# Patient Record
Sex: Female | Born: 1960 | Race: White | Hispanic: No | Marital: Married
Health system: Southern US, Community
[De-identification: ages and names within clinical notes are randomized; demographics above are authoritative.]

## PROBLEM LIST (undated history)

## (undated) DIAGNOSIS — E119 Type 2 diabetes mellitus without complications: Secondary | ICD-10-CM

## (undated) DIAGNOSIS — E079 Disorder of thyroid, unspecified: Secondary | ICD-10-CM

## (undated) DIAGNOSIS — E7800 Pure hypercholesterolemia, unspecified: Secondary | ICD-10-CM

## (undated) DIAGNOSIS — G44209 Tension-type headache, unspecified, not intractable: Secondary | ICD-10-CM

## (undated) DIAGNOSIS — I10 Essential (primary) hypertension: Secondary | ICD-10-CM

## (undated) DIAGNOSIS — C541 Malignant neoplasm of endometrium: Secondary | ICD-10-CM

## (undated) DIAGNOSIS — C801 Malignant (primary) neoplasm, unspecified: Secondary | ICD-10-CM

## (undated) DIAGNOSIS — M810 Age-related osteoporosis without current pathological fracture: Secondary | ICD-10-CM

## (undated) DIAGNOSIS — E669 Obesity, unspecified: Secondary | ICD-10-CM

## (undated) HISTORY — PX: CHOLECYSTECTOMY: SHX55

## (undated) HISTORY — PX: ABDOMINAL HYSTERECTOMY: SHX81

---

## 2002-06-23 ENCOUNTER — Emergency Department (HOSPITAL_COMMUNITY): Admission: EM | Admit: 2002-06-23 | Discharge: 2002-06-23 | Payer: Self-pay | Admitting: Emergency Medicine

## 2014-09-25 ENCOUNTER — Emergency Department (HOSPITAL_COMMUNITY)
Admission: EM | Admit: 2014-09-25 | Discharge: 2014-09-26 | Disposition: A | Discharge status: Home / Self Care | Payer: Managed Care, Other (non HMO) | Attending: Emergency Medicine | Admitting: Emergency Medicine

## 2014-09-25 ENCOUNTER — Encounter (HOSPITAL_COMMUNITY): Payer: Self-pay | Admitting: *Deleted

## 2014-09-25 DIAGNOSIS — R630 Anorexia: Secondary | ICD-10-CM | POA: Insufficient documentation

## 2014-09-25 DIAGNOSIS — Z8542 Personal history of malignant neoplasm of other parts of uterus: Secondary | ICD-10-CM | POA: Insufficient documentation

## 2014-09-25 DIAGNOSIS — I10 Essential (primary) hypertension: Secondary | ICD-10-CM | POA: Diagnosis not present

## 2014-09-25 DIAGNOSIS — M810 Age-related osteoporosis without current pathological fracture: Secondary | ICD-10-CM | POA: Insufficient documentation

## 2014-09-25 DIAGNOSIS — E780 Pure hypercholesterolemia: Secondary | ICD-10-CM | POA: Diagnosis not present

## 2014-09-25 DIAGNOSIS — R1031 Right lower quadrant pain: Secondary | ICD-10-CM | POA: Insufficient documentation

## 2014-09-25 DIAGNOSIS — Z90710 Acquired absence of both cervix and uterus: Secondary | ICD-10-CM | POA: Insufficient documentation

## 2014-09-25 DIAGNOSIS — Z79899 Other long term (current) drug therapy: Secondary | ICD-10-CM | POA: Diagnosis not present

## 2014-09-25 DIAGNOSIS — E119 Type 2 diabetes mellitus without complications: Secondary | ICD-10-CM | POA: Insufficient documentation

## 2014-09-25 DIAGNOSIS — R319 Hematuria, unspecified: Secondary | ICD-10-CM

## 2014-09-25 DIAGNOSIS — E669 Obesity, unspecified: Secondary | ICD-10-CM | POA: Insufficient documentation

## 2014-09-25 DIAGNOSIS — E079 Disorder of thyroid, unspecified: Secondary | ICD-10-CM | POA: Diagnosis not present

## 2014-09-25 DIAGNOSIS — Z9049 Acquired absence of other specified parts of digestive tract: Secondary | ICD-10-CM | POA: Diagnosis not present

## 2014-09-25 HISTORY — DX: Type 2 diabetes mellitus without complications: E11.9

## 2014-09-25 HISTORY — DX: Tension-type headache, unspecified, not intractable: G44.209

## 2014-09-25 HISTORY — DX: Essential (primary) hypertension: I10

## 2014-09-25 HISTORY — DX: Age-related osteoporosis without current pathological fracture: M81.0

## 2014-09-25 HISTORY — DX: Obesity, unspecified: E66.9

## 2014-09-25 HISTORY — DX: Disorder of thyroid, unspecified: E07.9

## 2014-09-25 HISTORY — DX: Malignant (primary) neoplasm, unspecified: C80.1

## 2014-09-25 HISTORY — DX: Malignant neoplasm of endometrium: C54.1

## 2014-09-25 HISTORY — DX: Pure hypercholesterolemia, unspecified: E78.00

## 2014-09-25 LAB — COMPREHENSIVE METABOLIC PANEL
ALT: 19 U/L (ref 0–35)
AST: 18 U/L (ref 0–37)
Albumin: 3.7 g/dL (ref 3.5–5.2)
Alkaline Phosphatase: 88 U/L (ref 39–117)
Anion gap: 5 (ref 5–15)
BUN: 11 mg/dL (ref 6–23)
CALCIUM: 9.1 mg/dL (ref 8.4–10.5)
CO2: 25 mmol/L (ref 19–32)
Chloride: 110 mmol/L (ref 96–112)
Creatinine, Ser: 0.88 mg/dL (ref 0.50–1.10)
GFR calc Af Amer: 84 mL/min — ABNORMAL LOW (ref >90)
GFR, EST NON AFRICAN AMERICAN: 73 mL/min — AB (ref >90)
GLUCOSE: 133 mg/dL — AB (ref 70–99)
Potassium: 4 mmol/L (ref 3.5–5.1)
SODIUM: 140 mmol/L (ref 135–145)
Total Bilirubin: 0.6 mg/dL (ref 0.3–1.2)
Total Protein: 7 g/dL (ref 6.0–8.3)

## 2014-09-25 LAB — LIPASE, BLOOD: Lipase: 40 U/L (ref 11–59)

## 2014-09-25 LAB — URINALYSIS, ROUTINE W REFLEX MICROSCOPIC
Bilirubin Urine: NEGATIVE
Glucose, UA: NEGATIVE mg/dL
Ketones, ur: NEGATIVE mg/dL
NITRITE: NEGATIVE
PH: 5 (ref 5.0–8.0)
Protein, ur: NEGATIVE mg/dL
Specific Gravity, Urine: 1.02 (ref 1.005–1.030)
UROBILINOGEN UA: 0.2 mg/dL (ref 0.0–1.0)

## 2014-09-25 LAB — URINE MICROSCOPIC-ADD ON

## 2014-09-25 LAB — CBC WITH DIFFERENTIAL/PLATELET
BASOS ABS: 0 10*3/uL (ref 0.0–0.1)
BASOS PCT: 0 % (ref 0–1)
Eosinophils Absolute: 0.1 10*3/uL (ref 0.0–0.7)
Eosinophils Relative: 1 % (ref 0–5)
HCT: 40.9 % (ref 36.0–46.0)
HEMOGLOBIN: 13.8 g/dL (ref 12.0–15.0)
LYMPHS PCT: 9 % — AB (ref 12–46)
Lymphs Abs: 0.8 10*3/uL (ref 0.7–4.0)
MCH: 30.9 pg (ref 26.0–34.0)
MCHC: 33.7 g/dL (ref 30.0–36.0)
MCV: 91.5 fL (ref 78.0–100.0)
Monocytes Absolute: 0.5 10*3/uL (ref 0.1–1.0)
Monocytes Relative: 5 % (ref 3–12)
NEUTROS PCT: 85 % — AB (ref 43–77)
Neutro Abs: 7.8 10*3/uL — ABNORMAL HIGH (ref 1.7–7.7)
Platelets: 260 10*3/uL (ref 150–400)
RBC: 4.47 MIL/uL (ref 3.87–5.11)
RDW: 14 % (ref 11.5–15.5)
WBC: 9.2 10*3/uL (ref 4.0–10.5)

## 2014-09-25 MED ORDER — IOHEXOL 300 MG/ML  SOLN
25.0000 mL | Freq: Once | INTRAMUSCULAR | Status: AC | PRN
  Administered 2014-09-25: 25 mL via ORAL

## 2014-09-25 NOTE — ED Notes (Signed)
Pt reports onset today of RLQ pain today. Pain occurs in waves, having nausea but no vomiting. Had hot flashes pta, not febrile at this time. Pt reports feeling like she needs to having bowel movement but unable to even pass gas.

## 2014-09-25 NOTE — ED Notes (Signed)
MD at bedside. 

## 2014-09-25 NOTE — ED Provider Notes (Signed)
CSN: [id number]     Arrival date & time 09/25/14  1811 History   First MD Initiated Contact with Patient 09/25/14 2036     Chief Complaint  Patient presents with  . Abdominal Pain     (Consider location/radiation/quality/duration/timing/severity/associated sxs/prior Treatment) HPI Comments: Patient presents with right lower quadrant pain that onset today that is beginning worse. Associated with nausea but no vomiting. Associated with hot flashes at home and chills but no fever. No urinary or vaginal symptoms. Unable to pass gas today. Patient with previous hysterectomy and cholecystectomy. Has never had this pain before. Decreased appetite. No chest pain. No change in chronic back pain  The history is provided by the patient and a relative.    Past Medical History  Diagnosis Date  . Obesity   . Tension headache   . Thyroid disease   . High cholesterol   . Hypertension   . Osteoporosis   . Cancer   . Endometrial cancer   . Diabetes mellitus without complication    Past Surgical History  Procedure Laterality Date  . Cholecystectomy    . Abdominal hysterectomy     History reviewed. No pertinent family history. History  Substance Use Topics  . Smoking status: Not on file  . Smokeless tobacco: Not on file  . Alcohol Use: No   OB History    No data available     Review of Systems  Constitutional: Positive for activity change and appetite change. Negative for fever.  HENT: Negative for congestion and rhinorrhea.   Respiratory: Negative for cough, chest tightness and shortness of breath.   Cardiovascular: Negative for chest pain.  Gastrointestinal: Positive for nausea and abdominal pain. Negative for vomiting and constipation.  Genitourinary: Negative for dysuria, hematuria, vaginal bleeding and vaginal discharge.  Musculoskeletal: Negative for myalgias and arthralgias.  Skin: Negative for rash.  Neurological: Negative for dizziness, weakness and headaches.  A complete 10  system review of systems was obtained and all systems are negative except as noted in the HPI and PMH.      Allergies  Review of patient's allergies indicates no known allergies.  Home Medications   Prior to Admission medications   Medication Sig Start Date End Date Taking? Authorizing Provider  alendronate (FOSAMAX) 70 MG tablet Take 70 mg by mouth every Sunday. Take with a full glass of water on an empty stomach.   Yes Historical Provider, MD  atorvastatin (LIPITOR) 40 MG tablet Take 40 mg by mouth at bedtime.   Yes Historical Provider, MD  cholecalciferol (VITAMIN D) 1000 UNITS tablet Take 1,000 Units by mouth daily.   Yes Historical Provider, MD  levothyroxine (SYNTHROID, LEVOTHROID) 175 MCG tablet Take 175 mcg by mouth daily before breakfast.   Yes Historical Provider, MD  losartan (COZAAR) 50 MG tablet Take 50 mg by mouth daily.   Yes Historical Provider, MD  magnesium gluconate (MAGONATE) 500 MG tablet Take 500 mg by mouth at bedtime.    Yes Historical Provider, MD  pregabalin (LYRICA) 75 MG capsule Take 75 mg by mouth at bedtime.   Yes Historical Provider, MD  topiramate (TOPAMAX) 25 MG tablet Take 100 mg by mouth at bedtime.   Yes Historical Provider, MD  venlafaxine XR (EFFEXOR-XR) 75 MG 24 hr capsule Take 75 mg by mouth daily with breakfast.   Yes Historical Provider, MD   BP 126/69 mmHg  Pulse 64  Temp(Src) 97.6 F (36.4 C) (Oral)  Resp 18  SpO2 97% Physical Exam  Constitutional: She  is oriented to person, place, and time. She appears well-developed and well-nourished. No distress.  HENT:  Head: Normocephalic and atraumatic.  Mouth/Throat: Oropharynx is clear and moist. No oropharyngeal exudate.  Eyes: Conjunctivae and EOM are normal. Pupils are equal, round, and reactive to light.  Neck: Normal range of motion. Neck supple.  No meningismus.  Cardiovascular: Normal rate, regular rhythm, normal heart sounds and intact distal pulses.   No murmur heard. Pulmonary/Chest:  Effort normal and breath sounds normal. No respiratory distress.  Abdominal: Soft. There is tenderness. There is guarding. There is no rebound.  RLQ pain with guarding  Musculoskeletal: Normal range of motion. She exhibits no edema or tenderness.  No CVAT  Neurological: She is alert and oriented to person, place, and time. No cranial nerve deficit. She exhibits normal muscle tone. Coordination normal.  No ataxia on finger to nose bilaterally. No pronator drift. 5/5 strength throughout. CN 2-12 intact. Negative Romberg. Equal grip strength. Sensation intact. Gait is normal.   Skin: Skin is warm.  Psychiatric: She has a normal mood and affect. Her behavior is normal.  Nursing note and vitals reviewed.   ED Course  Procedures (including critical care time) Labs Review Labs Reviewed  CBC WITH DIFFERENTIAL/PLATELET - Abnormal; Notable for the following:    Neutrophils Relative % 85 (*)    Neutro Abs 7.8 (*)    Lymphocytes Relative 9 (*)    All other components within normal limits  COMPREHENSIVE METABOLIC PANEL - Abnormal; Notable for the following:    Glucose, Bld 133 (*)    GFR calc non Af Amer 73 (*)    GFR calc Af Amer 84 (*)    All other components within normal limits  URINALYSIS, ROUTINE W REFLEX MICROSCOPIC - Abnormal; Notable for the following:    APPearance CLOUDY (*)    Hgb urine dipstick LARGE (*)    Leukocytes, UA SMALL (*)    All other components within normal limits  URINE MICROSCOPIC-ADD ON - Abnormal; Notable for the following:    Bacteria, UA FEW (*)    All other components within normal limits  LIPASE, BLOOD    Imaging Review No results found.   EKG Interpretation None      MDM   Final diagnoses:  None   right lower quadrant pain. Urinalysis shows small hemoglobin. Question recently passed kidney stone versus appendicitis.  UA with some hematuria. Labs otherwise unremarkable.  CT scan will be obtained to rule out appendicitis. Consider also recently  passed kidney stone.  Care will be transferred to Dr. Claudine Mouton at shift change.    Ezequiel Essex, MD 09/26/14 [phone]

## 2014-09-26 ENCOUNTER — Encounter (HOSPITAL_COMMUNITY): Payer: Self-pay | Admitting: Radiology

## 2014-09-26 ENCOUNTER — Emergency Department (HOSPITAL_COMMUNITY): Payer: Managed Care, Other (non HMO)

## 2014-09-26 MED ORDER — IBUPROFEN 800 MG PO TABS: 800.0000 mg | ORAL_TABLET | Freq: Three times a day (TID) | ORAL | Status: AC | PRN

## 2014-09-26 MED ORDER — IOHEXOL 300 MG/ML  SOLN
100.0000 mL | Freq: Once | INTRAMUSCULAR | Status: AC | PRN
  Administered 2014-09-26: 100 mL via INTRAVENOUS

## 2014-09-26 NOTE — ED Provider Notes (Signed)
This patient was signed out as pending CT scan to evaluate for nephrolithiasis versus appendicitis. CT scan is negative for appendicitis. Urine does show hematuria, patient also states her urine has been darker over the past couple of days. I believe this is consistent with nephrolithiasis. Urology follow-up was given to the patient. On repeat evaluation patient has minimal tenderness in the right lower quadrant. She is advised to continue Motrin at home as needed for pain. She demonstrated understanding to this. Her vital signs remain within her normal limits and she is safe for discharge.  Everlene Balls, MD 09/26/14 [phone]

## 2014-09-26 NOTE — ED Notes (Signed)
Pt. Left with all belongings 

## 2014-09-26 NOTE — Discharge Instructions (Signed)
Abdominal Pain, Women Ms. Shadix, your CT scan shows a normal appendix and no cause of your pain.  Your may have passed a kidney stone as there was microscopic blood in your urine.  Follow up with urology for continued management.  If symptoms worsen, come back to the ED for repeat evaluation.  Thank you. Abdominal (stomach, pelvic, or belly) pain can be caused by many things. It is important to tell your doctor:  The location of the pain.  Does it come and go or is it present all the time?  Are there things that start the pain (eating certain foods, exercise)?  Are there other symptoms associated with the pain (fever, nausea, vomiting, diarrhea)? All of this is helpful to know when trying to find the cause of the pain. CAUSES   Stomach: virus or bacteria infection, or ulcer.  Intestine: appendicitis (inflamed appendix), regional ileitis (Crohn's disease), ulcerative colitis (inflamed colon), irritable bowel syndrome, diverticulitis (inflamed diverticulum of the colon), or cancer of the stomach or intestine.  Gallbladder disease or stones in the gallbladder.  Kidney disease, kidney stones, or infection.  Pancreas infection or cancer.  Fibromyalgia (pain disorder).  Diseases of the female organs:  Uterus: fibroid (non-cancerous) tumors or infection.  Fallopian tubes: infection or tubal pregnancy.  Ovary: cysts or tumors.  Pelvic adhesions (scar tissue).  Endometriosis (uterus lining tissue growing in the pelvis and on the pelvic organs).  Pelvic congestion syndrome (female organs filling up with blood just before the menstrual period).  Pain with the menstrual period.  Pain with ovulation (producing an egg).  Pain with an IUD (intrauterine device, birth control) in the uterus.  Cancer of the female organs.  Functional pain (pain not caused by a disease, may improve without treatment).  Psychological pain.  Depression. DIAGNOSIS  Your doctor will decide the  seriousness of your pain by doing an examination.  Blood tests.  X-rays.  Ultrasound.  CT scan (computed tomography, special type of X-ray).  MRI (magnetic resonance imaging).  Cultures, for infection.  Barium enema (dye inserted in the large intestine, to better view it with X-rays).  Colonoscopy (looking in intestine with a lighted tube).  Laparoscopy (minor surgery, looking in abdomen with a lighted tube).  Major abdominal exploratory surgery (looking in abdomen with a large incision). TREATMENT  The treatment will depend on the cause of the pain.   Many cases can be observed and treated at home.  Over-the-counter medicines recommended by your caregiver.  Prescription medicine.  Antibiotics, for infection.  Birth control pills, for painful periods or for ovulation pain.  Hormone treatment, for endometriosis.  Nerve blocking injections.  Physical therapy.  Antidepressants.  Counseling with a psychologist or psychiatrist.  Minor or major surgery. HOME CARE INSTRUCTIONS   Do not take laxatives, unless directed by your caregiver.  Take over-the-counter pain medicine only if ordered by your caregiver. Do not take aspirin because it can cause an upset stomach or bleeding.  Try a clear liquid diet (broth or water) as ordered by your caregiver. Slowly move to a bland diet, as tolerated, if the pain is related to the stomach or intestine.  Have a thermometer and take your temperature several times a day, and record it.  Bed rest and sleep, if it helps the pain.  Avoid sexual intercourse, if it causes pain.  Avoid stressful situations.  Keep your follow-up appointments and tests, as your caregiver orders.  If the pain does not go away with medicine or surgery, you  may try:  Acupuncture.  Relaxation exercises (yoga, meditation).  Group therapy.  Counseling. SEEK MEDICAL CARE IF:   You notice certain foods cause stomach pain.  Your home care  treatment is not helping your pain.  You need stronger pain medicine.  You want your IUD removed.  You feel faint or lightheaded.  You develop nausea and vomiting.  You develop a rash.  You are having side effects or an allergy to your medicine. SEEK IMMEDIATE MEDICAL CARE IF:   Your pain does not go away or gets worse.  You have a fever.  Your pain is felt only in portions of the abdomen. The right side could possibly be appendicitis. The left lower portion of the abdomen could be colitis or diverticulitis.  You are passing blood in your stools (bright red or black tarry stools, with or without vomiting).  You have blood in your urine.  You develop chills, with or without a fever.  You pass out. MAKE SURE YOU:   Understand these instructions.  Will watch your condition.  Will get help right away if you are not doing well or get worse. Document Released: 05/23/2007 Document Revised: 12/10/2013 Document Reviewed: 06/12/2009 [facility] Patient Information 2015 [place], [place]. This information is not intended to replace advice given to you by your health care provider. Make sure you discuss any questions you have with your health care provider.

## 2019-12-10 ENCOUNTER — Other Ambulatory Visit: Payer: Self-pay | Admitting: Orthopedic Surgery

## 2019-12-10 DIAGNOSIS — M25512 Pain in left shoulder: Secondary | ICD-10-CM

## 2019-12-10 DIAGNOSIS — M25511 Pain in right shoulder: Secondary | ICD-10-CM

## 2019-12-14 ENCOUNTER — Ambulatory Visit
Admission: RE | Admit: 2019-12-14 | Discharge: 2019-12-14 | Disposition: A | Discharge status: Home / Self Care | Payer: 59 | Source: Ambulatory Visit | Attending: Orthopedic Surgery | Admitting: Orthopedic Surgery

## 2019-12-14 ENCOUNTER — Other Ambulatory Visit: Payer: Self-pay | Admitting: Orthopedic Surgery

## 2019-12-14 DIAGNOSIS — M25512 Pain in left shoulder: Secondary | ICD-10-CM

## 2019-12-14 DIAGNOSIS — M25511 Pain in right shoulder: Secondary | ICD-10-CM

## 2021-06-16 ENCOUNTER — Other Ambulatory Visit: Payer: Self-pay | Admitting: Sports Medicine

## 2021-06-16 DIAGNOSIS — M5440 Lumbago with sciatica, unspecified side: Secondary | ICD-10-CM

## 2021-07-08 ENCOUNTER — Ambulatory Visit
Admission: RE | Admit: 2021-07-08 | Discharge: 2021-07-08 | Disposition: A | Discharge status: Home / Self Care | Payer: No Typology Code available for payment source | Source: Ambulatory Visit | Attending: Sports Medicine | Admitting: Sports Medicine

## 2021-07-08 DIAGNOSIS — M5440 Lumbago with sciatica, unspecified side: Secondary | ICD-10-CM

## 2023-06-07 IMAGING — CT CT L SPINE W/O CM
1 of 6 series · 7 of 14 positions shown, 9 images · non-contrast
Comparison: CT abdomen and pelvis 06/10/2020

CLINICAL DATA: Chronic low back pain. Numbness and tingling in the
feet. History of endometrial cancer.

EXAM:
CT LUMBAR SPINE WITHOUT CONTRAST
TECHNIQUE: Multidetector CT imaging of the lumbar spine was performed without
intravenous contrast administration. Multiplanar CT image
reconstructions were also generated.

[Series 3: l spine soft · axial · 0.37mm/px · z∈[-311,-141]mm · 7 of 115 slices shown, 9 images]
[im 15/115  soft-tissue]
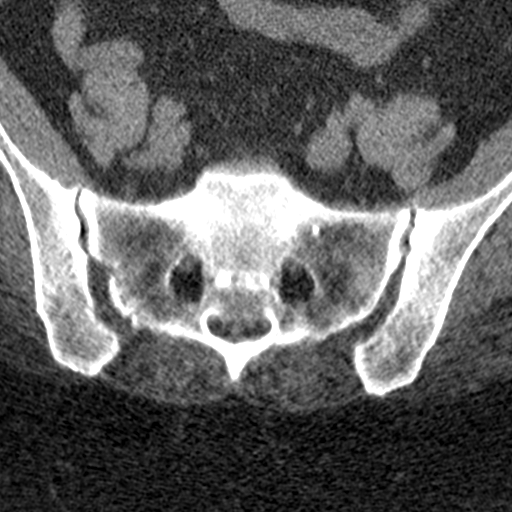
[im 15/115  bone]
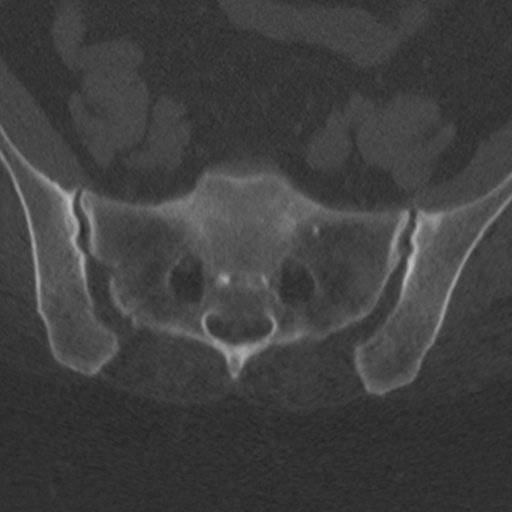
[im 29/115  bone]
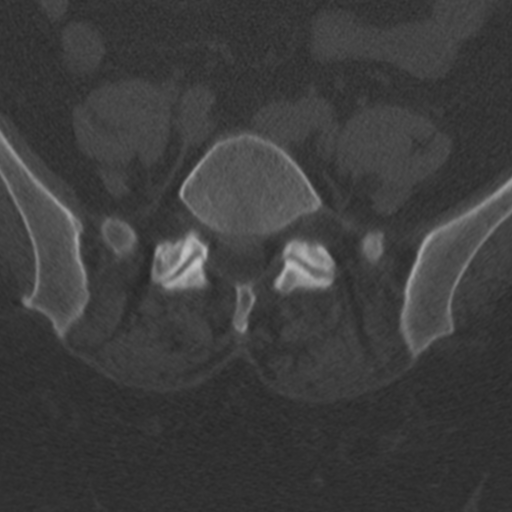
[im 43/115  bone]
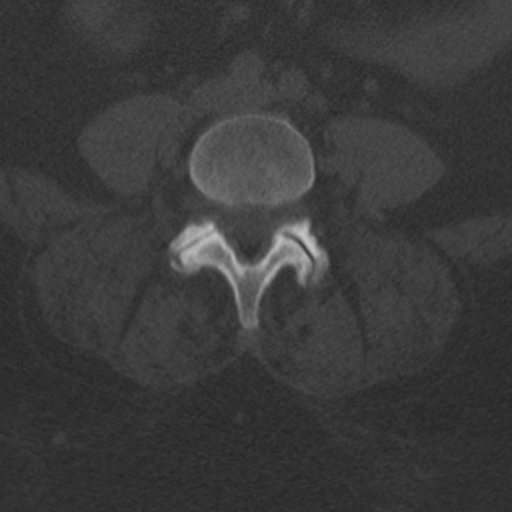
[im 58/115  bone]
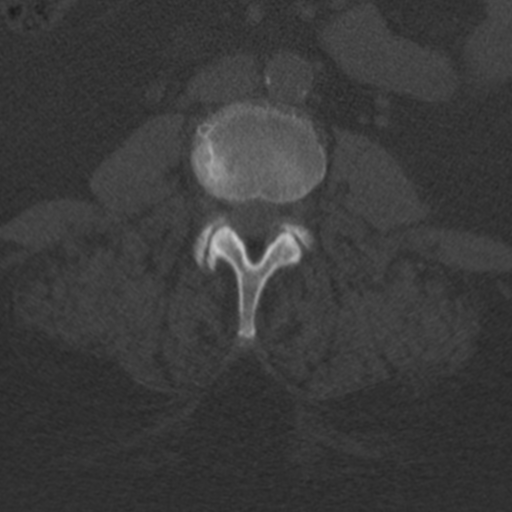
[im 72/115  soft-tissue]
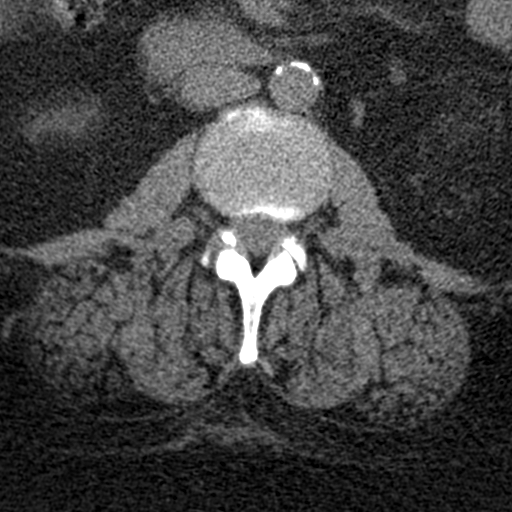
[im 72/115  bone]
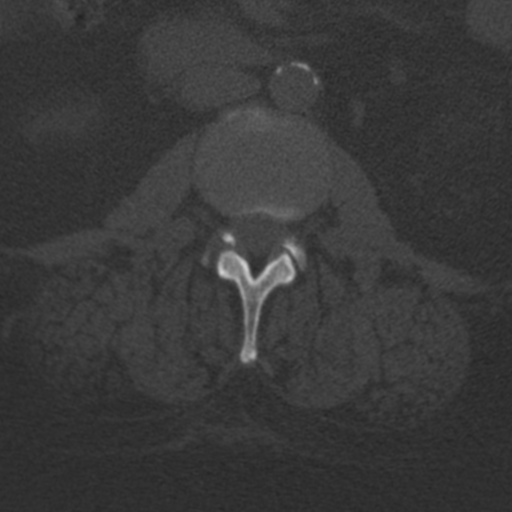
[im 86/115  bone]
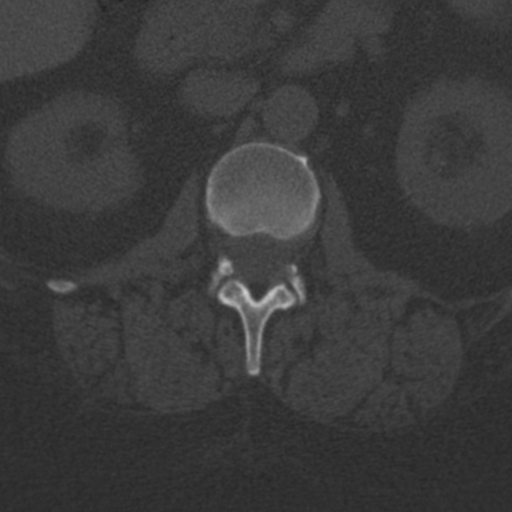
[im 100/115  bone]
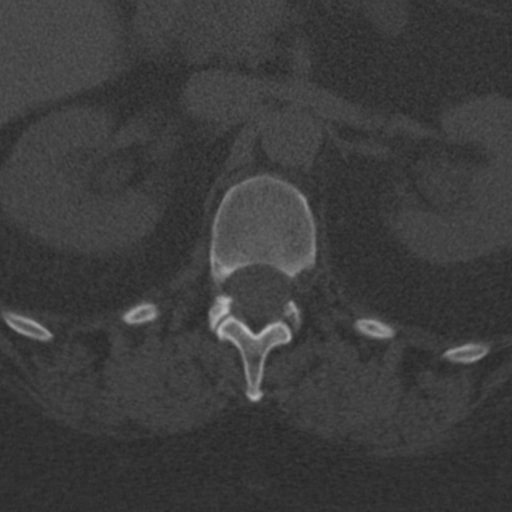

[7 of 14 positions shown; findings below may reference images not displayed]

FINDINGS: Segmentation: 5 lumbar type vertebrae.

Alignment: Trace facet mediated anterolisthesis of L4 on L5. Trace
retrolisthesis of L2 on L3.

Vertebrae: No acute fracture or suspicious osseous lesion.

Paraspinal and other soft tissues: Abdominal aortic atherosclerosis.
Punctate nonobstructing bilateral renal calculi.

Disc levels:

T12-L1 and L1-2: Negative.

L2-3: Trace retrolisthesis and mild facet and ligamentum flavum
hypertrophy without evidence of significant stenosis.

L3-4: Mild disc space narrowing. Mild disc bulging and moderate
facet hypertrophy without evidence of significant stenosis.

L4-5: Anterolisthesis with bulging uncovered disc and severe facet
arthrosis result in mild bilateral neural foraminal stenosis without
evidence of significant spinal stenosis.

L5-S1: Disc bulging and moderate facet arthrosis result in mild
right neural foraminal stenosis. There is a broad central disc
protrusion resulting in borderline spinal stenosis.
IMPRESSION: 1. Severe facet arthrosis at L4-5 with grade 1 anterolisthesis and
mild bilateral neural foraminal stenosis.
2. Borderline spinal stenosis at L5-S1 due to a broad central disc
protrusion.
3. Aortic Atherosclerosis ([id number]).

## 2024-08-05 ENCOUNTER — Emergency Department (HOSPITAL_BASED_OUTPATIENT_CLINIC_OR_DEPARTMENT_OTHER)
Admission: EM | Admit: 2024-08-05 | Discharge: 2024-08-06 | Disposition: A | Attending: Emergency Medicine | Admitting: Emergency Medicine

## 2024-08-05 ENCOUNTER — Encounter (HOSPITAL_BASED_OUTPATIENT_CLINIC_OR_DEPARTMENT_OTHER): Payer: Self-pay | Admitting: Emergency Medicine

## 2024-08-05 ENCOUNTER — Other Ambulatory Visit: Payer: Self-pay

## 2024-08-05 DIAGNOSIS — I10 Essential (primary) hypertension: Secondary | ICD-10-CM | POA: Diagnosis not present

## 2024-08-05 DIAGNOSIS — E119 Type 2 diabetes mellitus without complications: Secondary | ICD-10-CM | POA: Diagnosis not present

## 2024-08-05 DIAGNOSIS — F411 Generalized anxiety disorder: Secondary | ICD-10-CM | POA: Insufficient documentation

## 2024-08-05 NOTE — ED Triage Notes (Signed)
 Pt reports panic attack just before 8p tonight; reports high stress level lately; pt c/o brain fog ; A & O x 4 in triage

## 2024-08-06 LAB — BASIC METABOLIC PANEL WITH GFR
Anion gap: 11 (ref 5–15)
BUN: 17 mg/dL (ref 8–23)
CO2: 25 mmol/L (ref 22–32)
Calcium: 10.6 mg/dL — ABNORMAL HIGH (ref 8.9–10.3)
Chloride: 105 mmol/L (ref 98–111)
Creatinine, Ser: 0.84 mg/dL (ref 0.44–1.00)
GFR, Estimated: >60 mL/min
Glucose, Bld: 153 mg/dL — ABNORMAL HIGH (ref 70–99)
Potassium: 4.4 mmol/L (ref 3.5–5.1)
Sodium: 141 mmol/L (ref 135–145)

## 2024-08-06 LAB — URINALYSIS, ROUTINE W REFLEX MICROSCOPIC
Glucose, UA: NEGATIVE mg/dL
Hgb urine dipstick: NEGATIVE
Ketones, ur: NEGATIVE mg/dL
Leukocytes,Ua: NEGATIVE
Nitrite: NEGATIVE
Protein, ur: 30 mg/dL — AB
Specific Gravity, Urine: >=1.03 (ref 1.005–1.030)
pH: 5.5 (ref 5.0–8.0)

## 2024-08-06 LAB — CBC WITH DIFFERENTIAL/PLATELET
Abs Immature Granulocytes: 0.01 K/uL (ref 0.00–0.07)
Basophils Absolute: 0 K/uL (ref 0.0–0.1)
Basophils Relative: 0 %
Eosinophils Absolute: 0.2 K/uL (ref 0.0–0.5)
Eosinophils Relative: 3 %
HCT: 39.8 % (ref 36.0–46.0)
Hemoglobin: 13.2 g/dL (ref 12.0–15.0)
Immature Granulocytes: 0 %
Lymphocytes Relative: 20 %
Lymphs Abs: 1.2 K/uL (ref 0.7–4.0)
MCH: 31.7 pg (ref 26.0–34.0)
MCHC: 33.2 g/dL (ref 30.0–36.0)
MCV: 95.4 fL (ref 80.0–100.0)
Monocytes Absolute: 0.4 K/uL (ref 0.1–1.0)
Monocytes Relative: 7 %
Neutro Abs: 4.1 K/uL (ref 1.7–7.7)
Neutrophils Relative %: 70 %
Platelets: 261 K/uL (ref 150–400)
RBC: 4.17 MIL/uL (ref 3.87–5.11)
RDW: 13.9 % (ref 11.5–15.5)
WBC: 5.8 K/uL (ref 4.0–10.5)
nRBC: 0 % (ref 0.0–0.2)

## 2024-08-06 LAB — URINALYSIS, MICROSCOPIC (REFLEX)

## 2024-08-06 MED ORDER — HYDROXYZINE HCL 25 MG PO TABS
25.0000 mg | ORAL_TABLET | Freq: Once | ORAL | Status: AC
  Administered 2024-08-06: 25 mg via ORAL
  Filled 2024-08-06: qty 1

## 2024-08-06 MED ORDER — HYDROXYZINE HCL 25 MG PO TABS: 25.0000 mg | ORAL_TABLET | Freq: Three times a day (TID) | ORAL | 0 refills | Status: AC | PRN

## 2024-08-06 NOTE — Discharge Instructions (Signed)
 I am starting you on some as needed medicine for anxiety.  This does not take the place of seeing your psychiatrist who may feel that you benefit from daily medication to help control symptoms.  This was only to be taken when you are having severe anxiety symptoms.  It may cause some drowsiness.  Please follow closely with your primary care doctor as well.  I have listed the name of the behavioral health urgent care should your symptoms suddenly worsen or you could always return to the emergency department as needed.

## 2024-08-06 NOTE — ED Provider Notes (Signed)
 "  Emergency Department Provider Note   I have reviewed the triage vital signs and the nursing notes.   HISTORY  Chief Complaint Anxiety   HPI Courtney Barton is a 63 y.o. female with past history reviewed below presents to the emergency department with self-described anxiety symptoms.  She states that her husband has had several health issues recently requiring more care than normal.  Her sleep is decreased and she is feeling increasingly anxious and overwhelmed at times.  She has no thoughts of harming herself or others.  No depression symptoms.  Denies drinking alcohol or using drugs.  She had been on Cymbalta for depression but stopped that in October but did not notice the current symptoms until this month.  Today, she had a wave of anxiety type feeling without chest pain or shortness of breath.  No other medical symptoms.  Notes that she is not been able to sleep in the last 18 hours.  She was prescribed trazodone to help with sleep earlier today but feels like her anxiety worsened when she was reading through the label.  She notes she does not want to feel loopy and so did not try this. She was driven in today by her neighbor and is accompanied by her husband.  Past Medical History:  Diagnosis Date   Cancer (HCC)    Diabetes mellitus without complication (HCC)    Endometrial cancer (HCC)    High cholesterol    Hypertension    Obesity    Osteoporosis    Tension headache    Thyroid disease     Review of Systems  Constitutional: No fever/chills Cardiovascular: Denies chest pain. Respiratory: Denies shortness of breath. Gastrointestinal: No abdominal pain.  No nausea, no vomiting.  No diarrhea.  No constipation. Genitourinary: Negative for dysuria. Musculoskeletal: Negative for back pain. Skin: Negative for rash. Neurological: Negative for headaches, focal weakness or numbness. Psychiatric: Positive anxiety symptoms. No SI/HI.     ____________________________________________   PHYSICAL EXAM:  VITAL SIGNS: ED Triage Vitals  Encounter Vitals Group     BP 08/05/24 2147 (!) 141/57     Pulse Rate 08/05/24 2147 68     Resp 08/05/24 2147 16     Temp 08/05/24 2147 97.6 F (36.4 C)     Temp src --      SpO2 08/05/24 2147 100 %   Constitutional: Alert and oriented. Well appearing and in no acute distress. Occasional pacing.  Eyes: Conjunctivae are normal.  Head: Atraumatic. Nose: No congestion/rhinnorhea. Mouth/Throat: Mucous membranes are moist. Neck: No stridor.  Cardiovascular: Normal rate, regular rhythm. Good peripheral circulation. Grossly normal heart sounds.   Respiratory: Normal respiratory effort.  No retractions. Lungs CTAB. Gastrointestinal: No distention.  Musculoskeletal: No gross deformities of extremities. Neurologic:  Normal speech and language.  Skin:  Skin is warm, dry and intact. No rash noted. Psychiatric: Mood and affect are slightly anxious. Not responding to internal stimulus. Does not appear manic. Speech and behavior are normal.  ____________________________________________   LABS (all labs ordered are listed, but only abnormal results are displayed)  Labs Reviewed  BASIC METABOLIC PANEL WITH GFR - Abnormal; Notable for the following components:      Result Value   Glucose, Bld 153 (*)    Calcium 10.6 (*)    All other components within normal limits  URINALYSIS, ROUTINE W REFLEX MICROSCOPIC - Abnormal; Notable for the following components:   APPearance HAZY (*)    Bilirubin Urine SMALL (*)    Protein,  ur 30 (*)    All other components within normal limits  URINALYSIS, MICROSCOPIC (REFLEX) - Abnormal; Notable for the following components:   Bacteria, UA FEW (*)    All other components within normal limits  CBC WITH DIFFERENTIAL/PLATELET   ____________________________________________   PROCEDURES  Procedure(s) performed:   Procedures  None   ____________________________________________   INITIAL IMPRESSION / ASSESSMENT AND PLAN / ED COURSE  Pertinent labs & imaging results that were available during my care of the patient were reviewed by me and considered in my medical decision making (see chart for details).   This patient is Presenting for Evaluation of anxiety, which does require a range of treatment options, and is a complaint that involves a moderate risk of morbidity and mortality.  The Differential Diagnoses include anxiety, depression, care giving fatigue, AKI, anemia, UTI, etc.  Critical Interventions-    Medications  hydrOXYzine  (ATARAX ) tablet 25 mg (25 mg Oral Given 08/06/24 0146)   Clinical Laboratory Tests Ordered, included UA without infection.  CBC without leukocytosis or anemia.  Basic metabolic panel shows mild hyperglycemia to 153 without DKA.  No AKI.   Medical Decision Making: Summary:  The patient presents emergency department with moderate anxiety symptoms.  No specific medical complaints accompanying the symptoms.  Plan for screening blood work and reassess.  Reevaluation with update and discussion with patient and husband at bedside.  She is feeling intermittently anxious while in the emergency department.  She does not have thoughts of harming herself or others.  We discussed treatment options for her situational anxiety and will try Atarax .  She has ride home.  I will send this to the pharmacy.  She has been referred to a psychiatrist as well and encouraged her to keep this appointment.  Also provided contact information for behavioral health urgent care should symptoms return or worsen suddenly.    Patient's presentation is most consistent with acute, uncomplicated illness.   Disposition: discharge  ____________________________________________  FINAL CLINICAL IMPRESSION(S) / ED DIAGNOSES  Final diagnoses:  Anxiety state     NEW OUTPATIENT MEDICATIONS STARTED DURING THIS VISIT:  New  Prescriptions   HYDROXYZINE  (ATARAX ) 25 MG TABLET    Take 1 tablet (25 mg total) by mouth every 8 (eight) hours as needed for anxiety.    Note:  This document was prepared using Dragon voice recognition software and may include unintentional dictation errors.  Fonda Law, MD, [facility] Emergency Medicine    Bentzion Dauria, Fonda MATSU, MD 08/06/24 [phone]  "
